# Patient Record
Sex: Female | Born: 1955
Health system: Southern US, Community
[De-identification: ages and names within clinical notes are randomized; demographics above are authoritative.]

## PROBLEM LIST (undated history)

## (undated) HISTORY — PX: TONSILLECTOMY: SUR1361

---

## 2000-10-31 ENCOUNTER — Other Ambulatory Visit: Admission: RE | Admit: 2000-10-31 | Discharge: 2000-10-31 | Payer: Self-pay | Admitting: Obstetrics and Gynecology

## 2015-03-14 ENCOUNTER — Encounter: Payer: Self-pay | Admitting: Family Medicine

## 2015-03-14 ENCOUNTER — Ambulatory Visit (INDEPENDENT_AMBULATORY_CARE_PROVIDER_SITE_OTHER): Payer: BLUE CROSS/BLUE SHIELD | Admitting: Family Medicine

## 2015-03-14 ENCOUNTER — Ambulatory Visit
Admission: RE | Admit: 2015-03-14 | Discharge: 2015-03-14 | Disposition: A | Discharge status: Home / Self Care | Payer: BLUE CROSS/BLUE SHIELD | Source: Ambulatory Visit | Attending: Family Medicine | Admitting: Family Medicine

## 2015-03-14 VITALS — BP 132/82 | HR 64 | Ht 67.0 in | Wt 166.8 lb

## 2015-03-14 DIAGNOSIS — M542 Cervicalgia: Secondary | ICD-10-CM

## 2015-03-14 NOTE — Progress Notes (Signed)
   Subjective:    Patient ID: Krista Stokes, female    DOB: 03/16/1956, 59 y.o.   MRN: [id number]  HPI Chief Complaint  Patient presents with  . neck pain    neck pain for last 4 days. sharp neck pain and shoots into mid left shoulder. thinks it may be from reading her tablet alot and having to hold it up with left arm. taking aleve but not relief. looking to the right agiates her neck pain   She is a new patient to this practice and here to establish primary care.  States she has not seen a provider in over 15 years.  She is also here for complaints of 5 day history of neck pain that feels sharp intermittently and radiates into left lateral shoulder otherrwise a dull ache. Pain is worse with certain movements. States today pain is considerably better, approximately 50 %. States similar pain on 2 other occasions after reading on her tablet and holding it in her left hand. Works on [employer] all day, [place] and [place].  Denies injury, no history of neck or back pain pain. Denies numbness, tingling, weakness. Denies headache, dizziness.  Has taken 2 Aleve twice daily for past 5 days, not today. Has also used ice and heat to neck. Other flare ups occurred in September and again in August and lasted about a week each time.  Past medical history: had migraines in past but rarely now. Takes excedrin migraine if she gets one.  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  Eye doctor- goes regularly.  FH: mother strokes, father MI at age 58, deceased.   Smoking- 2 per day. Drinks 2 drinks glasses of wine nightly.   Reviewed allergies, medications, past medical, surgical and social history.  Review of Systems Pertinent positives and negatives in the history of present illness.    Objective:   Physical Exam  Constitutional: She appears well-developed and well-nourished. No distress.  Neck: Normal range of motion. Neck supple. No spinous process  tenderness and no muscular tenderness present.  Musculoskeletal:       Cervical back: She exhibits pain. She exhibits no tenderness, no bony tenderness, no edema and no spasm.  Normal strength and ROM, however extension and right rotation and lateral bending reproduces pain to left lateral neck.  Skin: Skin is warm and dry. No rash noted. No pallor.   BP 132/82 mmHg  Pulse 64  Ht 5\' 7"  (1.702 m)  Wt 166 lb 12.8 oz (75.66 kg)  BMI 26.12 kg/m2     Assessment & Plan:  Neck pain, acute - Plan: DG Cervical Spine Complete  Discussed that she should continue using anti-inflammatories, heat and stretching. Suspect that she had a neck spasm that is improving. Recommend good body mechanics and paying attention to her neck being in neutral especially when she is driving and on the computer. Will consider physical therapy if she has another flare. She is also going to start yoga again, thinks this has helped in past. Recommend that she return for fasting labs and a physical exam since it has been several years since she had this. She will also need colonoscopy and Pap smear and mammogram.

## 2015-06-03 ENCOUNTER — Encounter: Payer: Self-pay | Admitting: Family Medicine

## 2015-06-03 ENCOUNTER — Ambulatory Visit (INDEPENDENT_AMBULATORY_CARE_PROVIDER_SITE_OTHER): Payer: BLUE CROSS/BLUE SHIELD | Admitting: Family Medicine

## 2015-06-03 VITALS — BP 132/80 | HR 64 | Temp 97.9°F | Resp 14 | Wt 165.0 lb

## 2015-06-03 DIAGNOSIS — J209 Acute bronchitis, unspecified: Secondary | ICD-10-CM | POA: Diagnosis not present

## 2015-06-03 DIAGNOSIS — R062 Wheezing: Secondary | ICD-10-CM | POA: Diagnosis not present

## 2015-06-03 DIAGNOSIS — R05 Cough: Secondary | ICD-10-CM | POA: Diagnosis not present

## 2015-06-03 DIAGNOSIS — J0140 Acute pansinusitis, unspecified: Secondary | ICD-10-CM

## 2015-06-03 DIAGNOSIS — R059 Cough, unspecified: Secondary | ICD-10-CM

## 2015-06-03 MED ORDER — ALBUTEROL SULFATE HFA 108 (90 BASE) MCG/ACT IN AERS: 2.0000 | INHALATION_SPRAY | Freq: Four times a day (QID) | RESPIRATORY_TRACT | Status: DC | PRN

## 2015-06-03 MED ORDER — AMOXICILLIN-POT CLAVULANATE 875-125 MG PO TABS: 1.0000 | ORAL_TABLET | Freq: Two times a day (BID) | ORAL | Status: DC

## 2015-06-03 NOTE — Patient Instructions (Addendum)
If you are not back to normal after completing the antibiotic let me know.  Use the inhaler as needed. You shouldn't the inhaler for more than a few days. Let me know if you are using it more often than prescribed or for a longer period.  Acute Bronchitis Bronchitis is inflammation of the airways that extend from the windpipe into the lungs (bronchi). The inflammation often causes mucus to develop. This leads to a cough, which is the most common symptom of bronchitis.  In acute bronchitis, the condition usually develops suddenly and goes away over time, usually in a couple weeks. Smoking, allergies, and asthma can make bronchitis worse. Repeated episodes of bronchitis may cause further lung problems.  CAUSES Acute bronchitis is most often caused by the same virus that causes a cold. The virus can spread from person to person (contagious) through coughing, sneezing, and touching contaminated objects. SIGNS AND SYMPTOMS   Cough.   Fever.   Coughing up mucus.   Body aches.   Chest congestion.   Chills.   Shortness of breath.   Sore throat.  DIAGNOSIS  Acute bronchitis is usually diagnosed through a physical exam. Your health care provider will also ask you questions about your medical history. Tests, such as chest X-rays, are sometimes done to rule out other conditions.  TREATMENT  Acute bronchitis usually goes away in a couple weeks. Oftentimes, no medical treatment is necessary. Medicines are sometimes given for relief of fever or cough. Antibiotic medicines are usually not needed but may be prescribed in certain situations. In some cases, an inhaler may be recommended to help reduce shortness of breath and control the cough. A cool mist vaporizer may also be used to help thin bronchial secretions and make it easier to clear the chest.  HOME CARE INSTRUCTIONS  Get plenty of rest.   Drink enough fluids to keep your urine clear or pale yellow (unless you have a medical condition  that requires fluid restriction). Increasing fluids may help thin your respiratory secretions (sputum) and reduce chest congestion, and it will prevent dehydration.   Take medicines only as directed by your health care provider.  If you were prescribed an antibiotic medicine, finish it all even if you start to feel better.  Avoid smoking and secondhand smoke. Exposure to cigarette smoke or irritating chemicals will make bronchitis worse. If you are a smoker, consider using nicotine gum or skin patches to help control withdrawal symptoms. Quitting smoking will help your lungs heal faster.   Reduce the chances of another bout of acute bronchitis by washing your hands frequently, avoiding people with cold symptoms, and trying not to touch your hands to your mouth, nose, or eyes.   Keep all follow-up visits as directed by your health care provider.  SEEK MEDICAL CARE IF: Your symptoms do not improve after 1 week of treatment.  SEEK IMMEDIATE MEDICAL CARE IF:  You develop an increased fever or chills.   You have chest pain.   You have severe shortness of breath.  You have bloody sputum.   You develop dehydration.  You faint or repeatedly feel like you are going to pass out.  You develop repeated vomiting.  You develop a severe headache. MAKE SURE YOU:   Understand these instructions.  Will watch your condition.  Will get help right away if you are not doing well or get worse.   This information is not intended to replace advice given to you by your health care provider. Make sure  you discuss any questions you have with your health care provider.   Document Released: 05/06/2004 Document Revised: 04/19/2014 Document Reviewed: 09/19/2012 Elsevier Interactive Patient Education [employer].

## 2015-06-03 NOTE — Progress Notes (Signed)
Subjective:  Krista Stokes is a 60 y.o. female who presents for a 10 day history of URI symtpoms that started with sore throat and headache and then she reports developing dry cough, sinus pressure and body aches. She states she has been wheezing and had tightness in her chest. Cough is worse with talking and during the day. Reports sleeping ok. Taking Mucinex DM. States cough and sinus pressure are getting worse. States her upper teeth even hurt.    Denies fever, sore throat, chest pain, DOE. Light smoker. No history of asthma, pneumonia, or bronchitis. No recent antibiotic use.   Treatment to date: antihistamines, cough suppressants and decongestants.  Denies sick contacts.  No other aggravating or relieving factors.  No other c/o.  ROS as in subjective.   Objective: Filed Vitals:   06/03/15 1430  BP: 132/80  Pulse: 64  Temp: 97.9 F (36.6 C)    General appearance: Alert, WD/WN, no distress, mildly ill appearing                             Skin: warm, no rash                           Head: mild frontal and maxillary sinus tenderness                            Eyes: conjunctiva normal, corneas clear, PERRLA                            Ears: pearly TMs, external ear canals normal                          Nose: septum midline, turbinates swollen left greater than right, with erythema and clear discharge             Mouth/throat: MMM, tongue normal, mild pharyngeal erythema, no exudate                           Neck: supple, no adenopathy, no thyromegaly, nontender                          Heart: RRR, normal S1, S2, no murmurs                         Lungs: wheezing bilaterally, + scattered rhonchi prior to breath treatment. Re-evaluation post breath tx reveals improved rhonchi and mild expiratory wheezes to RLL and LLL.      Assessment: Cough  Wheezing  Acute pansinusitis, recurrence not specified    Plan: Breathing treatment - reports feeling somewhat improved after  breathing treatment, continues to have some mild expiratory wheezes.  Discussed diagnosis and treatment of acute bronchitis and sinusitis.  Suggested symptomatic OTC remedies and staying well hydrated.  Nasal saline spray for congestion.  Tylenol or Ibuprofen OTC for fever and malaise. Mucinex DM for cough.  Albuterol inhaler prescribed for patient with instructions for use.  Augmentin prescribed and she will let me know if not 100% improved after completing it.

## 2015-06-04 DIAGNOSIS — R05 Cough: Secondary | ICD-10-CM | POA: Diagnosis not present

## 2015-06-04 DIAGNOSIS — R062 Wheezing: Secondary | ICD-10-CM | POA: Diagnosis not present

## 2015-06-04 MED ORDER — ALBUTEROL SULFATE (2.5 MG/3ML) 0.083% IN NEBU
2.5000 mg | INHALATION_SOLUTION | Freq: Once | RESPIRATORY_TRACT | Status: AC
  Administered 2015-06-04: 2.5 mg via RESPIRATORY_TRACT

## 2015-06-04 NOTE — Addendum Note (Signed)
Addended by: Herminio Commons A on: 06/04/2015 09:51 AM   Modules accepted: Orders

## 2016-02-04 DIAGNOSIS — Z23 Encounter for immunization: Secondary | ICD-10-CM | POA: Diagnosis not present

## 2017-01-10 IMAGING — CR DG CERVICAL SPINE COMPLETE 4+V
6 series · 6 of 6 positions shown · non-contrast
Comparison: None.

CLINICAL DATA: 59-year-old female with left neck pain for 4 months
radiating to the shoulder. Exacerbation x1 week. Initial encounter.

EXAM:
CERVICAL SPINE - COMPLETE 4+ VIEW

[w cervical spine lat]
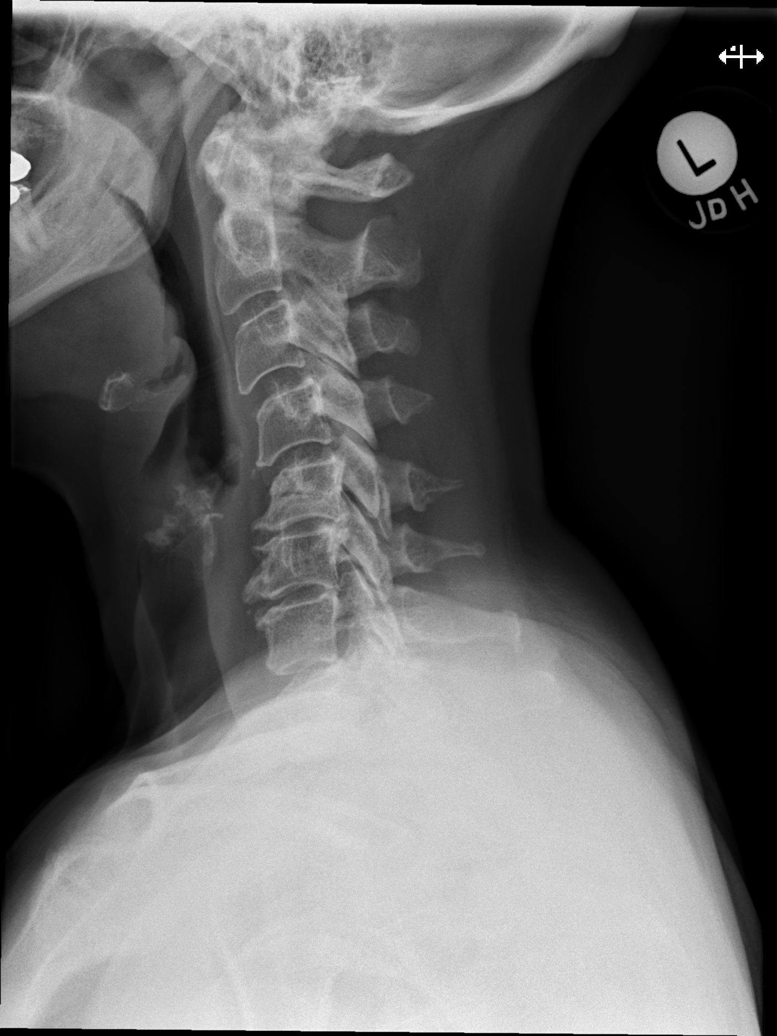

[w cervical spine ap_obl (1 of 2)]
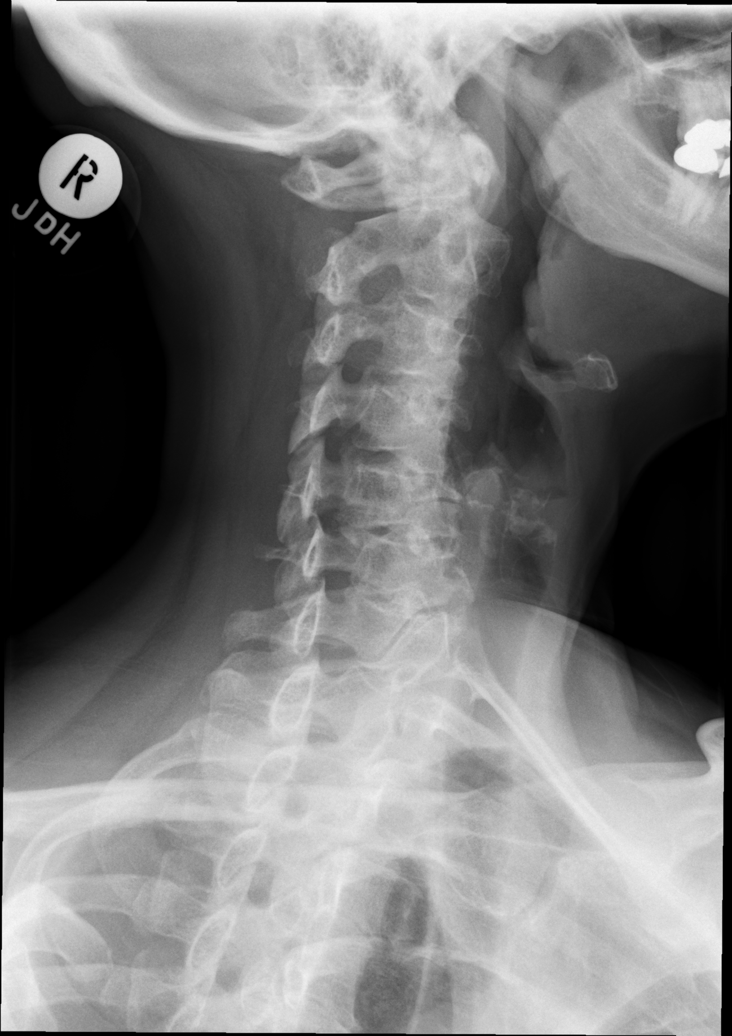

[w cervical spine ap_obl (2 of 2)]
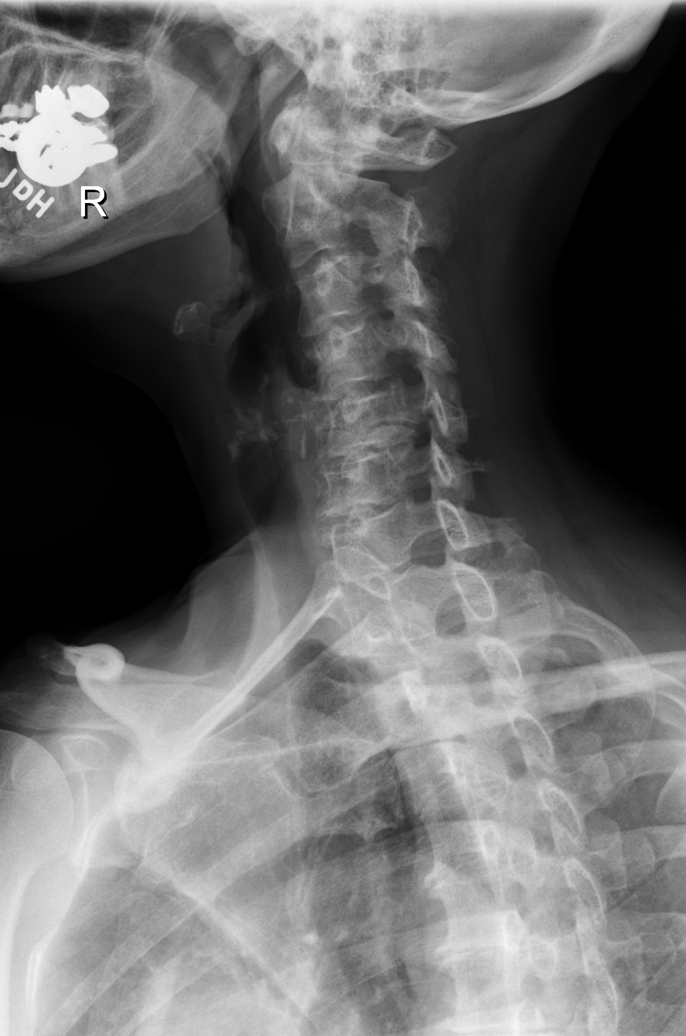

[w cervical spine ap]
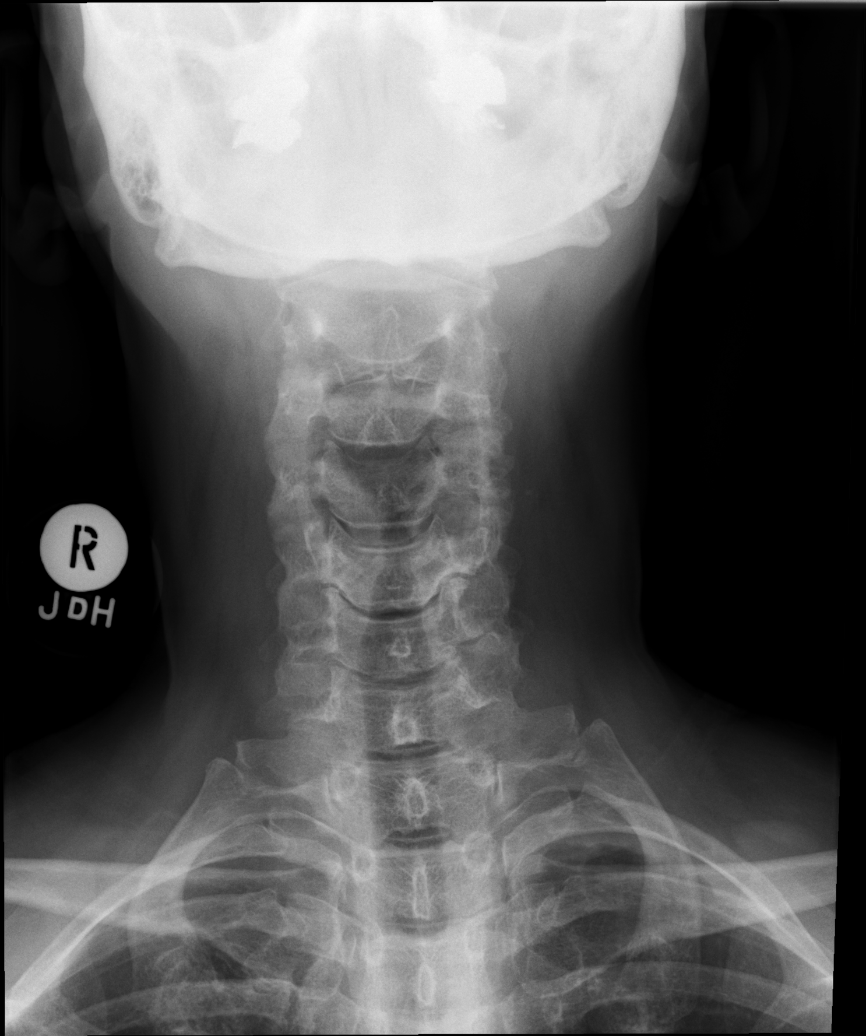

[w cervical swimmers]
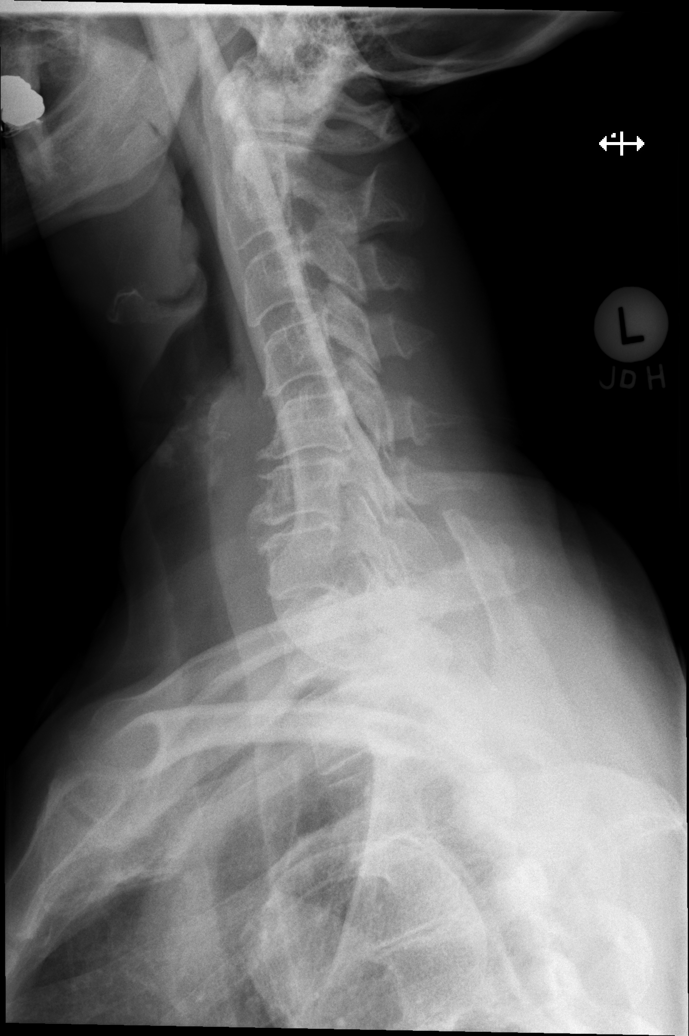

[t cervical spine odontoid]
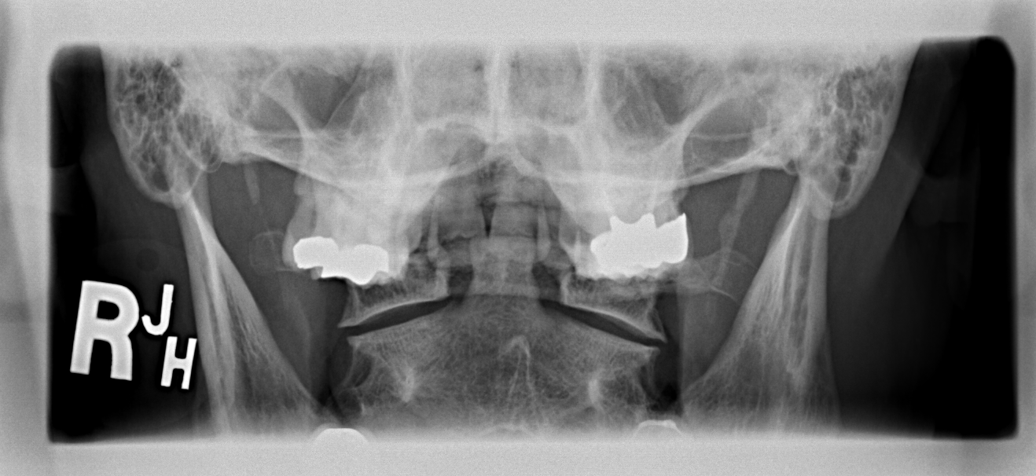

[6 of 6 positions shown; findings below may reference images not displayed]

FINDINGS: Focal reversal of cervical lordosis at C4-C5. Relatively preserved
disc space at that level. Trace anterolisthesis. Chronic disc and
endplate degeneration at C5-C6 and C6-C7. Bilateral posterior
element alignment is within normal limits. AP alignment and lung
apices within normal limits. Cervicothoracic junction alignment is
within normal limits. C1-C2 alignment and odontoid within normal
limits.
IMPRESSION: 1. Focal kyphosis at C4-C5 suspicious for soft tissue injury or
degeneration at that level.
2. Chronic disc and endplate degeneration at C5-C6 and C6-C7.
3.  No acute osseous abnormality identified.

## 2017-12-21 ENCOUNTER — Encounter: Payer: Self-pay | Admitting: Family Medicine

## 2017-12-21 ENCOUNTER — Ambulatory Visit
Admission: RE | Admit: 2017-12-21 | Discharge: 2017-12-21 | Disposition: A | Discharge status: Home / Self Care | Payer: BLUE CROSS/BLUE SHIELD | Source: Ambulatory Visit | Attending: Family Medicine | Admitting: Family Medicine

## 2017-12-21 ENCOUNTER — Ambulatory Visit: Payer: BLUE CROSS/BLUE SHIELD | Admitting: Family Medicine

## 2017-12-21 VITALS — BP 110/80 | HR 78 | Temp 97.8°F | Resp 16 | Wt 162.2 lb

## 2017-12-21 DIAGNOSIS — M549 Dorsalgia, unspecified: Secondary | ICD-10-CM

## 2017-12-21 DIAGNOSIS — M546 Pain in thoracic spine: Secondary | ICD-10-CM

## 2017-12-21 DIAGNOSIS — R0789 Other chest pain: Secondary | ICD-10-CM | POA: Diagnosis not present

## 2017-12-21 DIAGNOSIS — M47814 Spondylosis without myelopathy or radiculopathy, thoracic region: Secondary | ICD-10-CM | POA: Diagnosis not present

## 2017-12-21 LAB — POCT URINALYSIS DIP (PROADVANTAGE DEVICE)
BILIRUBIN UA: NEGATIVE
BILIRUBIN UA: NEGATIVE mg/dL
Blood, UA: NEGATIVE
Glucose, UA: NEGATIVE mg/dL
LEUKOCYTES UA: NEGATIVE
NITRITE UA: NEGATIVE
Protein Ur, POC: NEGATIVE mg/dL
Specific Gravity, Urine: 1.025
Urobilinogen, Ur: NEGATIVE
pH, UA: 6 (ref 5.0–8.0)

## 2017-12-21 MED ORDER — DICLOFENAC SODIUM 75 MG PO TBEC: 75.0000 mg | DELAYED_RELEASE_TABLET | Freq: Two times a day (BID) | ORAL | 0 refills | Status: AC

## 2017-12-21 NOTE — Patient Instructions (Signed)
Stop Aleve and take diclofenac with food. Use a heating pad. Use topical analgesic.  I will call you with your XR results.   Stop smoking. Find a healthier habit to replace smoking.
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
## 2018-01-25 DIAGNOSIS — H524 Presbyopia: Secondary | ICD-10-CM | POA: Diagnosis not present

## 2018-01-25 DIAGNOSIS — H2513 Age-related nuclear cataract, bilateral: Secondary | ICD-10-CM | POA: Diagnosis not present

## 2018-01-25 DIAGNOSIS — H5213 Myopia, bilateral: Secondary | ICD-10-CM | POA: Diagnosis not present

## 2018-02-08 DIAGNOSIS — Z23 Encounter for immunization: Secondary | ICD-10-CM | POA: Diagnosis not present

## 2019-01-14 DIAGNOSIS — Z23 Encounter for immunization: Secondary | ICD-10-CM | POA: Diagnosis not present

## 2019-02-09 ENCOUNTER — Telehealth: Payer: Self-pay | Admitting: Internal Medicine

## 2019-02-09 NOTE — Telephone Encounter (Signed)
Left message for pt to call back. Trying to close metrics. Breast cancer screening and colorectal screening

## 2019-10-20 IMAGING — CR DG CHEST 2V
2 series · 2 of 2 positions shown · non-contrast
Comparison: None.

CLINICAL DATA: Posterior left chest pain for 4 days. No known
injury.

EXAM:
CHEST - 2 VIEW

[w chest pa]
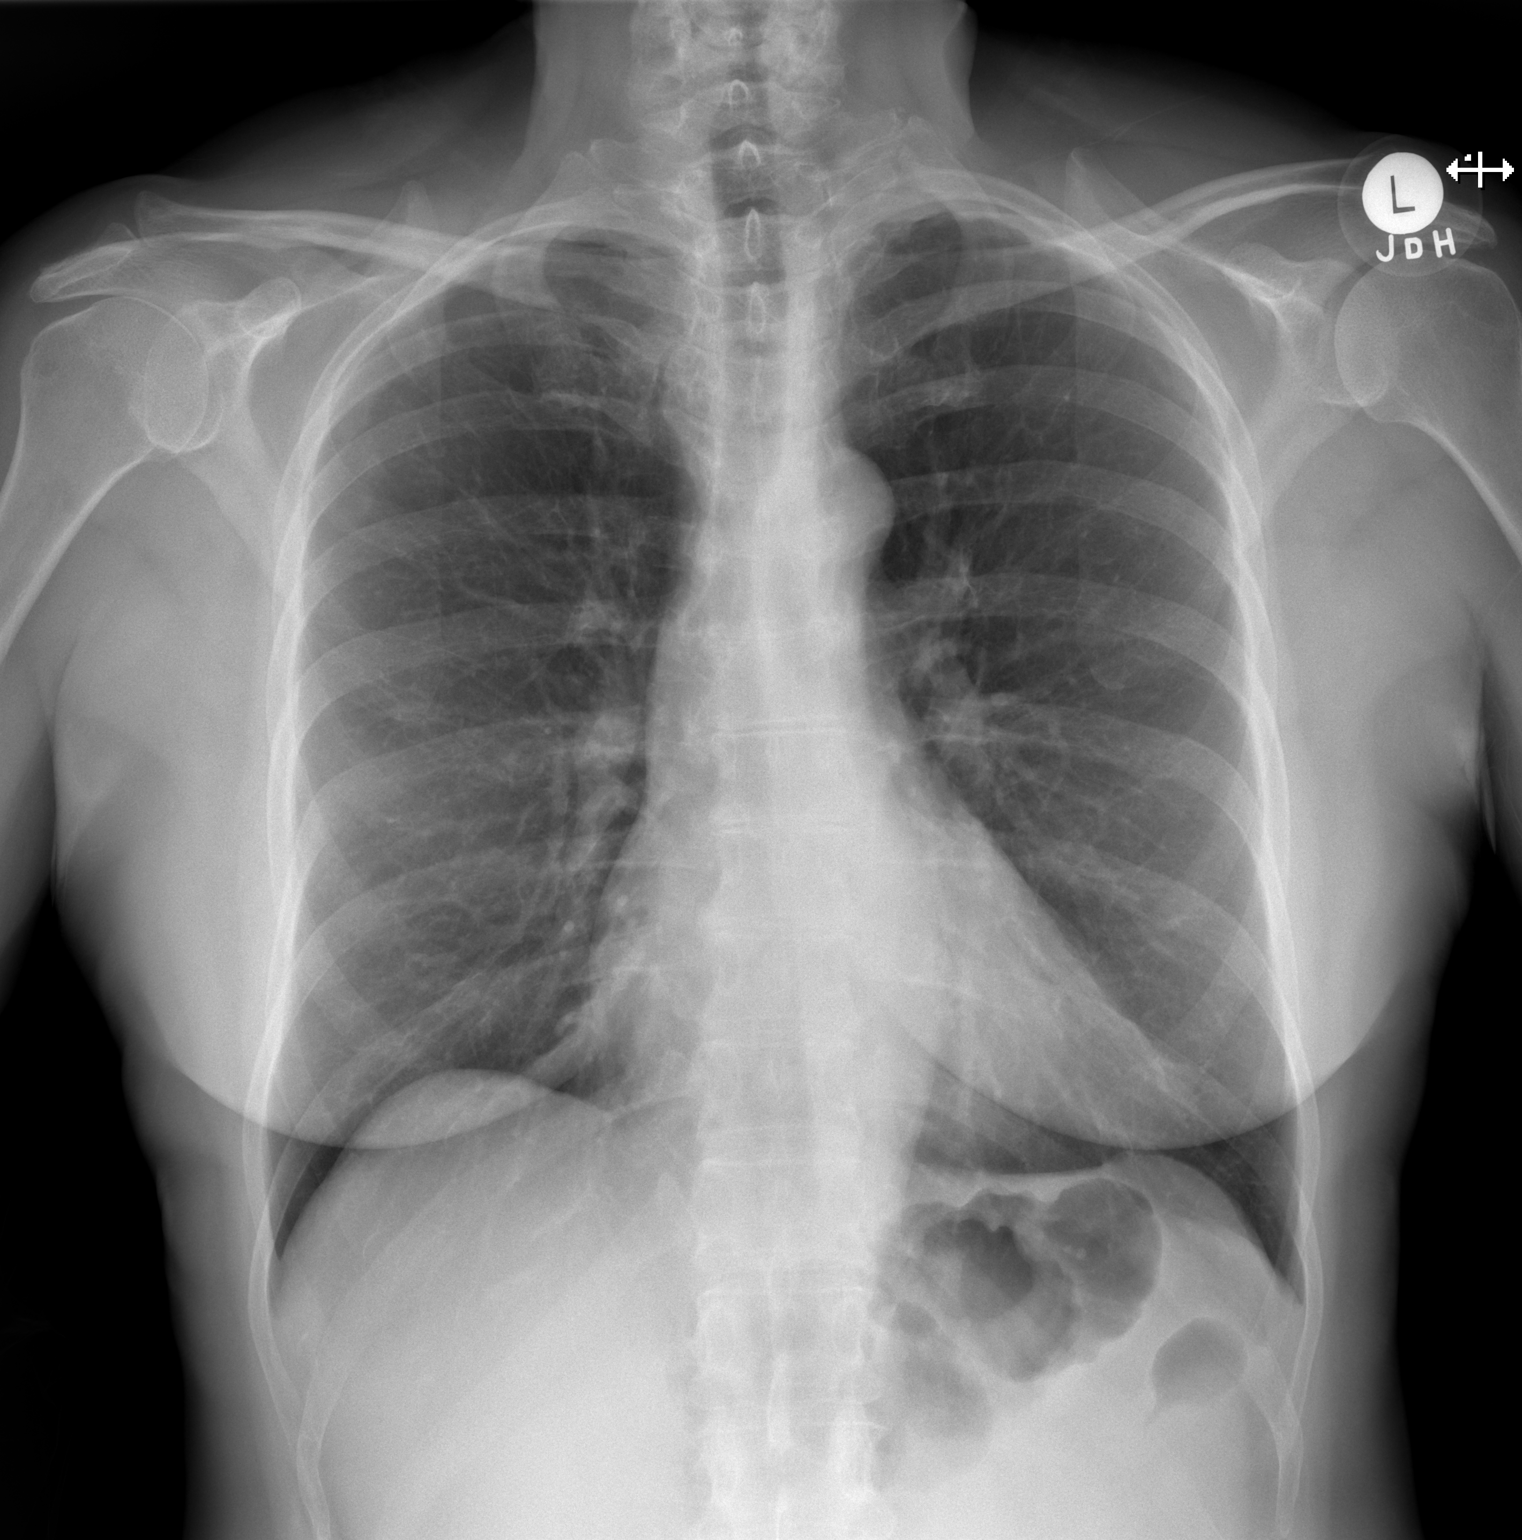

[w chest lat]
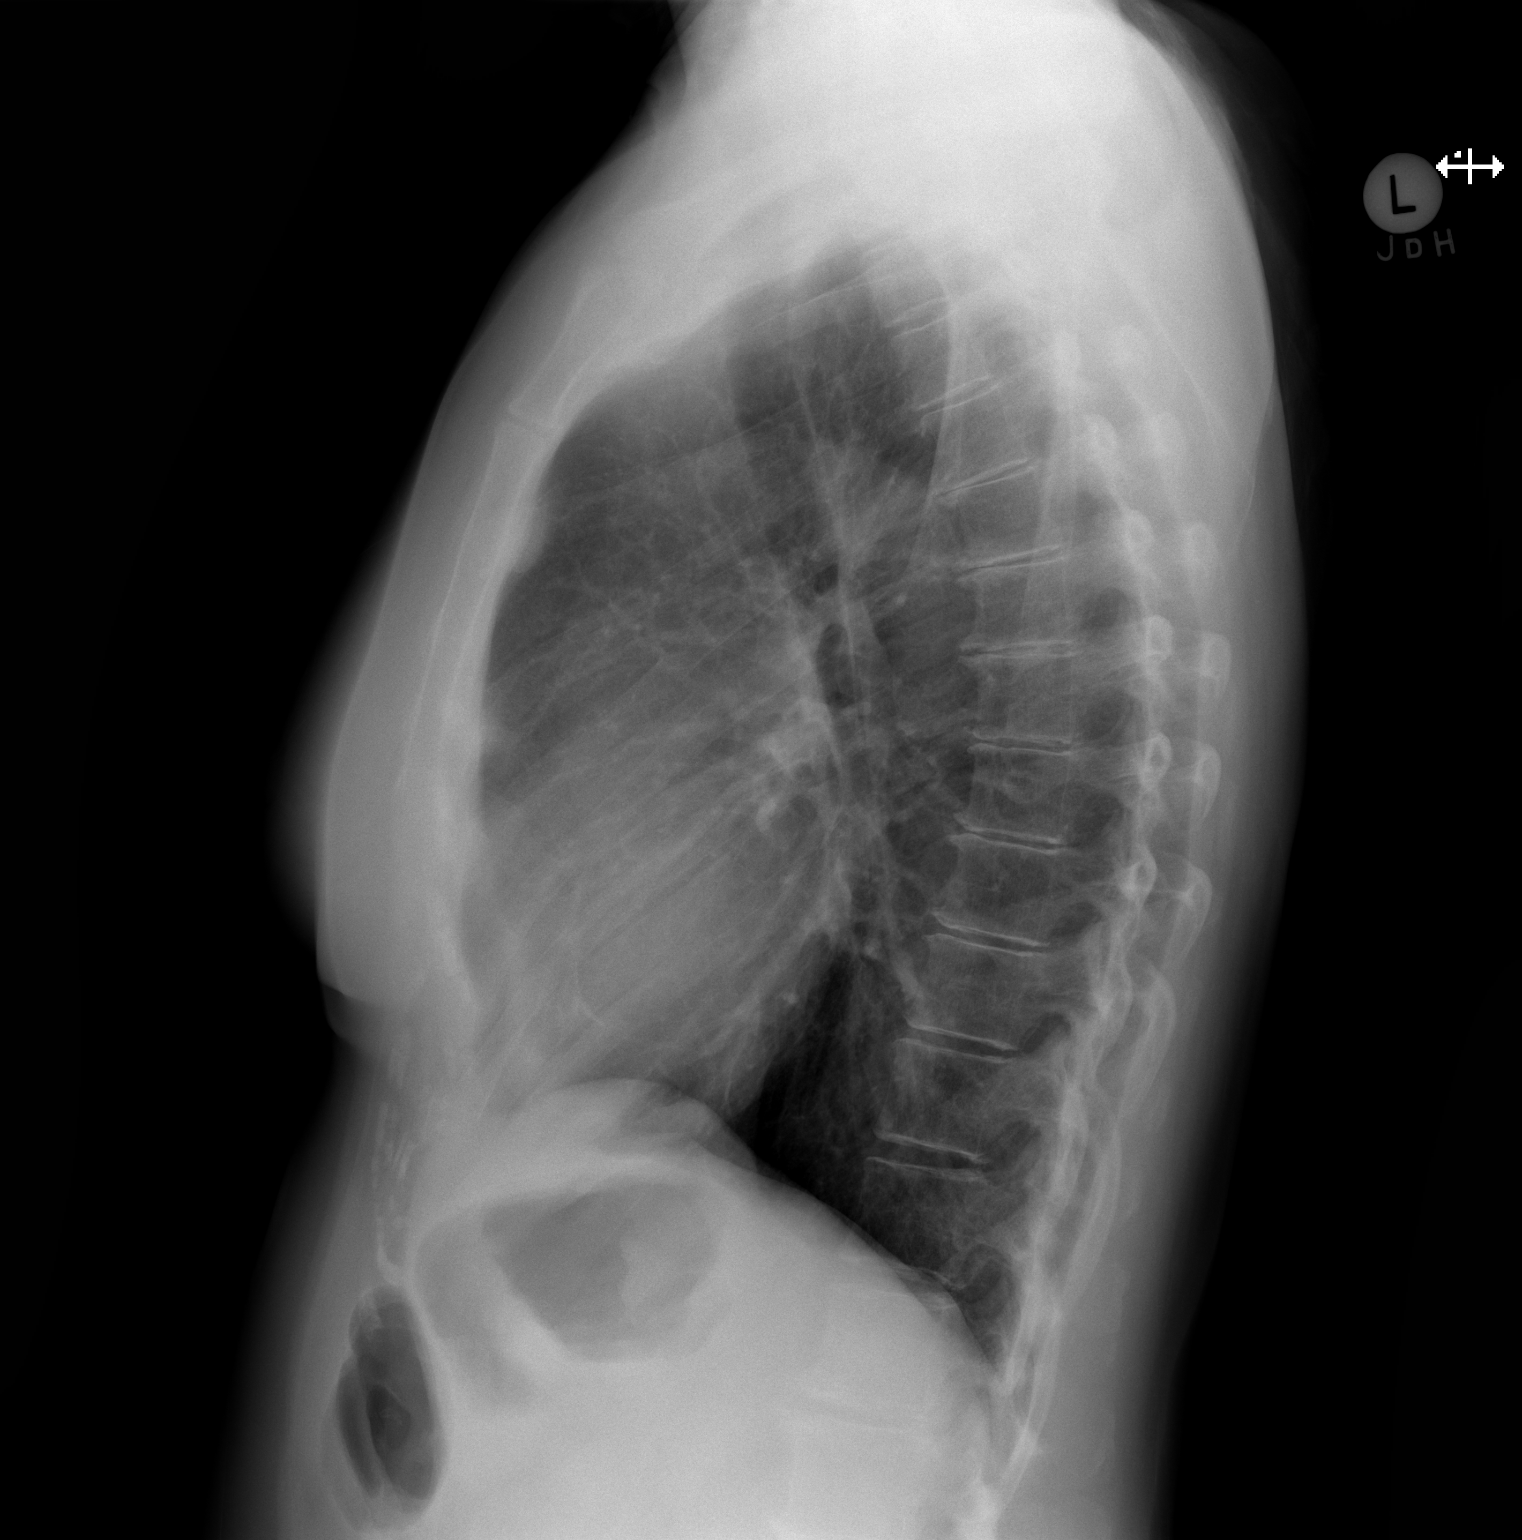

[2 of 2 positions shown; findings below may reference images not displayed]

FINDINGS: The lungs are clear. Heart size is normal. No pneumothorax or
pleural fluid. No acute or focal bony abnormality. Scattered
endplate spurring in the thoracic spine noted.
IMPRESSION: No acute disease.

## 2022-02-24 ENCOUNTER — Encounter: Payer: Self-pay | Admitting: Internal Medicine

## 2024-03-31 LAB — COLOGUARD: COLOGUARD: POSITIVE — AB

## 2024-05-16 ENCOUNTER — Telehealth: Payer: Self-pay | Admitting: Radiation Oncology

## 2024-05-16 NOTE — Telephone Encounter (Signed)
 2/4 @ 10:22 am Left voicemail for patient to call our office to be sch for consult.

## 2024-05-21 ENCOUNTER — Ambulatory Visit: Admitting: Radiation Oncology

## 2024-05-21 ENCOUNTER — Ambulatory Visit
